# Patient Record
Sex: Female | Born: 2000 | Race: White | Hispanic: No | Marital: Single | Smoking: Never smoker
Health system: Southern US, Community
[De-identification: ages and names within clinical notes are randomized; demographics above are authoritative.]

## PROBLEM LIST (undated history)

## (undated) DIAGNOSIS — J45909 Unspecified asthma, uncomplicated: Secondary | ICD-10-CM

## (undated) DIAGNOSIS — Z9109 Other allergy status, other than to drugs and biological substances: Secondary | ICD-10-CM

## (undated) HISTORY — PX: MYRINGOTOMY WITH TUBE PLACEMENT: SHX5663

---
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
## 2020-07-19 ENCOUNTER — Other Ambulatory Visit: Payer: Self-pay

## 2020-07-19 ENCOUNTER — Encounter (HOSPITAL_BASED_OUTPATIENT_CLINIC_OR_DEPARTMENT_OTHER): Payer: Self-pay

## 2020-07-19 ENCOUNTER — Emergency Department (HOSPITAL_BASED_OUTPATIENT_CLINIC_OR_DEPARTMENT_OTHER): Payer: BLUE CROSS/BLUE SHIELD

## 2020-07-19 ENCOUNTER — Inpatient Hospital Stay (HOSPITAL_BASED_OUTPATIENT_CLINIC_OR_DEPARTMENT_OTHER)
Admission: EM | Admit: 2020-07-19 | Discharge: 2020-07-21 | DRG: 419 | Disposition: A | Discharge status: Home / Self Care | Payer: BLUE CROSS/BLUE SHIELD | Source: Ambulatory Visit | Attending: Surgery | Admitting: Surgery

## 2020-07-19 DIAGNOSIS — K810 Acute cholecystitis: Principal | ICD-10-CM | POA: Diagnosis present

## 2020-07-19 DIAGNOSIS — Z20822 Contact with and (suspected) exposure to covid-19: Secondary | ICD-10-CM | POA: Diagnosis present

## 2020-07-19 DIAGNOSIS — K819 Cholecystitis, unspecified: Secondary | ICD-10-CM

## 2020-07-19 DIAGNOSIS — R1010 Upper abdominal pain, unspecified: Secondary | ICD-10-CM

## 2020-07-19 LAB — COMPREHENSIVE METABOLIC PANEL
ALT: 14 U/L (ref 0–44)
AST: 19 U/L (ref 15–41)
Albumin: 4.1 g/dL (ref 3.5–5.0)
Alkaline Phosphatase: 61 U/L (ref 38–126)
Anion gap: 11 (ref 5–15)
BUN: 5 mg/dL — ABNORMAL LOW (ref 6–20)
CO2: 20 mmol/L — ABNORMAL LOW (ref 22–32)
Calcium: 9.2 mg/dL (ref 8.9–10.3)
Chloride: 104 mmol/L (ref 98–111)
Creatinine, Ser: 0.6 mg/dL (ref 0.44–1.00)
GFR, Estimated: >60 mL/min (ref >60)
Glucose, Bld: 112 mg/dL — ABNORMAL HIGH (ref 70–99)
Potassium: 3.7 mmol/L (ref 3.5–5.1)
Sodium: 135 mmol/L (ref 135–145)
Total Bilirubin: 0.5 mg/dL (ref 0.3–1.2)
Total Protein: 7.7 g/dL (ref 6.5–8.1)

## 2020-07-19 LAB — RESPIRATORY PANEL BY RT PCR (FLU A&B, COVID)
Influenza A by PCR: NEGATIVE
Influenza B by PCR: NEGATIVE
SARS Coronavirus 2 by RT PCR: NEGATIVE

## 2020-07-19 LAB — CBC
HCT: 41.6 % (ref 36.0–46.0)
Hemoglobin: 14.3 g/dL (ref 12.0–15.0)
MCH: 29.3 pg (ref 26.0–34.0)
MCHC: 34.4 g/dL (ref 30.0–36.0)
MCV: 85.2 fL (ref 80.0–100.0)
Platelets: 269 10*3/uL (ref 150–400)
RBC: 4.88 MIL/uL (ref 3.87–5.11)
RDW: 12.3 % (ref 11.5–15.5)
WBC: 16.7 10*3/uL — ABNORMAL HIGH (ref 4.0–10.5)
nRBC: 0 % (ref 0.0–0.2)

## 2020-07-19 LAB — LIPASE, BLOOD: Lipase: 21 U/L (ref 11–51)

## 2020-07-19 MED ORDER — PIPERACILLIN-TAZOBACTAM 3.375 G IVPB 30 MIN
3.3750 g | Freq: Once | INTRAVENOUS | Status: AC
  Administered 2020-07-19: 3.375 g via INTRAVENOUS
  Filled 2020-07-19 (×2): qty 50

## 2020-07-19 MED ORDER — FENTANYL CITRATE (PF) 100 MCG/2ML IJ SOLN
50.0000 ug | Freq: Once | INTRAMUSCULAR | Status: AC
  Administered 2020-07-19: 50 ug via INTRAVENOUS
  Filled 2020-07-19: qty 2

## 2020-07-19 NOTE — ED Triage Notes (Signed)
Pt arrives with c/o abdominal pain after eating Chinese food last night reports NV shortly after eating was seen at [facility] and told to come to ED.

## 2020-07-19 NOTE — ED Provider Notes (Signed)
I assumed care of this patient.  Please see previous provider note for further details of Hx, PE.  Briefly patient is a 19 y.o. female who presented as a transfer from med [facility] where she was found to have acute cholecystitis.  Patient receiving her antibiotics.  Provided with additional pain medicine.  Dr. Cliffton Asters of general surgery made aware of patient's arrival who placed admission orders.       Nira Conn, MD 07/20/20 [phone]

## 2020-07-19 NOTE — Discharge Instructions (Addendum)
[facility] LAPAROSCOPIC SURGERY: POST OP INSTRUCTIONS Always review your discharge instruction sheet given to you by the facility where your surgery was performed. IF YOU HAVE DISABILITY OR FAMILY LEAVE FORMS, YOU MUST BRING THEM TO THE OFFICE FOR PROCESSING.   DO NOT GIVE THEM TO YOUR DOCTOR.  PAIN CONTROL  1. First take acetaminophen (Tylenol) AND/or ibuprofen (Advil) to control your pain after surgery.  Follow directions on package.  Taking acetaminophen (Tylenol) and/or ibuprofen (Advil) regularly after surgery will help to control your pain and lower the amount of prescription pain medication you may need.  You should not take more than 3,000 mg (3 grams) of acetaminophen (Tylenol) in 24 hours.  You should not take ibuprofen (Advil), aleve, motrin, naprosyn or other NSAIDS if you have a history of stomach ulcers or chronic kidney disease.  2. A prescription for pain medication may be given to you upon discharge.  Take your pain medication as prescribed, if you still have uncontrolled pain after taking acetaminophen (Tylenol) or ibuprofen (Advil). 3. Use ice packs to help control pain. 4. If you need a refill on your pain medication, please contact your pharmacy.  They will contact our office to request authorization. Prescriptions will not be filled after 5pm or on week-ends.  HOME MEDICATIONS 5. Take your usually prescribed medications unless otherwise directed.  DIET 6. You should follow a light diet the first few days after arrival home.  Be sure to include lots of fluids daily. Avoid fatty, fried foods.   CONSTIPATION 7. It is common to experience some constipation after surgery and if you are taking pain medication.  Increasing fluid intake and taking a stool softener (such as Colace) will usually help or prevent this problem from occurring.  A mild laxative (Milk of Magnesia or Miralax) should be taken according to package instructions if there are no bowel  movements after 48 hours.  WOUND/INCISION CARE 8. Most patients will experience some swelling and bruising in the area of the incisions.  Ice packs will help.  Swelling and bruising can take several days to resolve.  9. Unless discharge instructions indicate otherwise, follow guidelines below  a. STERI-STRIPS - you may remove your outer bandages 48 hours after surgery, and you may shower at that time.  You have steri-strips (small skin tapes) in place directly over the incision.  These strips should be left on the skin for 7-10 days.   b. DERMABOND/SKIN GLUE - you may shower in 24 hours.  The glue will flake off over the next 2-3 weeks. 10. Any sutures or staples will be removed at the office during your follow-up visit.  ACTIVITIES 11. You may resume regular (light) daily activities beginning the next day--such as daily self-care, walking, climbing stairs--gradually increasing activities as tolerated.  You may have sexual intercourse when it is comfortable.  Refrain from any heavy lifting or straining until approved by your doctor. a. You may drive when you are no longer taking prescription pain medication, you can comfortably wear a seatbelt, and you can safely maneuver your car and apply brakes.  FOLLOW-UP 12. You should see your doctor in the office for a follow-up appointment approximately 2-3 weeks after your surgery.  You should have been given your post-op/follow-up appointment when your surgery was scheduled.  If you did not receive a post-op/follow-up appointment, make sure that you call for this appointment within a day or two after you arrive home to insure a convenient appointment time.  WHEN   TO CALL YOUR DOCTOR: 1. Fever over 101.0 2. Inability to urinate 3. Continued bleeding from incision. 4. Increased pain, redness, or drainage from the incision. 5. Increasing abdominal pain  The clinic staff is available to answer your questions during regular business hours.  Please don't  hesitate to call and ask to speak to one of the nurses for clinical concerns.  If you have a medical emergency, go to the nearest emergency room or call 911.  A surgeon from [facility] is always on call at the hospital. [address], [address], [place], [place]  [address] ? [address] [address], , [place]   [address] [phone] ? [phone] ? FAX [phone] Web site: [email or web address]   Gallbladder Eating Plan If you have a gallbladder condition, you may have trouble digesting fats. Eating a low-fat diet can help reduce your symptoms, and may be helpful before and after having surgery to remove your gallbladder (cholecystectomy). Your health care provider may recommend that you work with a diet and nutrition specialist (dietitian) to help you reduce the amount of fat in your diet. What are tips for following this plan? General guidelines  Limit your fat intake to less than 30% of your total daily calories. If you eat around 1,800 calories each day, this is less than 60 grams (g) of fat per day.  Fat is an important part of a healthy diet. Eating a low-fat diet can make it hard to maintain a healthy body weight. Ask your dietitian how much fat, calories, and other nutrients you need each day.  Eat small, frequent meals throughout the day instead of three large meals.  Drink at least 8-10 cups of fluid a day. Drink enough fluid to keep your urine clear or pale yellow.  Limit alcohol intake to no more than 1 drink a day for nonpregnant women and 2 drinks a day for men. One drink equals 12 oz of beer, 5 oz of wine, or 1 oz of hard liquor. Reading food labels  Check Nutrition Facts on food labels for the amount of fat per serving. Choose foods with less than 3 grams of fat per serving. Shopping  Choose nonfat and low-fat healthy foods. Look for the words "nonfat," "low fat," or "fat free."  Avoid buying processed or prepackaged foods. Cooking  Cook  using low-fat methods, such as baking, broiling, grilling, or boiling.  Cook with small amounts of healthy fats, such as olive oil, grapeseed oil, canola oil, or sunflower oil. What foods are recommended?   All fresh, frozen, or canned fruits and vegetables.  Whole grains.  Low-fat or non-fat (skim) milk and yogurt.  Lean meat, skinless poultry, fish, eggs, and beans.  Low-fat protein supplement powders or drinks.  Spices and herbs. What foods are not recommended?  High-fat foods. These include baked goods, fast food, fatty cuts of meat, ice cream, french toast, sweet rolls, pizza, cheese bread, foods covered with butter, creamy sauces, or cheese.  Fried foods. These include french fries, tempura, battered fish, breaded chicken, fried breads, and sweets.  Foods with strong odors.  Foods that cause bloating and gas. Summary  A low-fat diet can be helpful if you have a gallbladder condition, or before and after gallbladder surgery.  Limit your fat intake to less than 30% of your total daily calories. This is about 60 g of fat if you eat 1,800 calories each day.  Eat small, frequent meals throughout the day instead of three large meals. This information   is not intended to replace advice given to you by your health care provider. Make sure you discuss any questions you have with your health care provider. Document Revised: 12/26/2018 Document Reviewed: 10/12/2016 Elsevier Patient Education  2020 [employer]vier Inc.  

## 2020-07-19 NOTE — ED Provider Notes (Signed)
[facility] EMERGENCY DEPARTMENT Provider Note   CSN: [id number] Arrival date & time: 07/19/20  1653     History Chief Complaint  Patient presents with  . Abdominal Pain    Kaitlyn Dunn is a 19 y.o. female presenting for evaluation of n/v and abd pain.   Pt states she ate chinese food last night and immediate had n/v and upper abd pain. She continued to have n/v throughout the night. She went to UC, was tx with zofran and vomiting resolved. however she continues to have severe abd pain, worsening after any p.o. intake, including liquids.  She denies fevers, chills, chest pain, shortness breath, cough, lower abdominal pain, urinary symptoms, normal bowel movements.  No previous history of abdominal problems.  She is never had abdominal surgeries.  No one else around her is sick.  HPI     Past Medical History:  Diagnosis Date  . Asthma   . Environmental allergies     There are no problems to display for this patient.   Past Surgical History:  Procedure Laterality Date  . MYRINGOTOMY WITH TUBE PLACEMENT       OB History   No obstetric history on file.     No family history on file.  Social History   Tobacco Use  . Smoking status: Never Smoker  . Smokeless tobacco: Never Used  Substance Use Topics  . Alcohol use: No  . Drug use: No    Home Medications Prior to Admission medications   Medication Sig Start Date End Date Taking? Authorizing Provider  dextromethorphan-guaiFENesin (MUCINEX DM) 30-600 MG 12hr tablet Take 1 tablet by mouth 2 (two) times daily. 01/06/18   Aviva Kluver B, PA-C  lidocaine (XYLOCAINE) 2 % solution Use as directed 15 mLs in the mouth or throat every 4 (four) hours as needed. 11/24/14   Pisciotta, Joni Reining, PA-C  Loratadine (CLARITIN PO) Take by mouth.    [provider]    Allergies    Patient has no known allergies.  Review of Systems   Review of Systems  Gastrointestinal: Positive for abdominal pain, nausea and  vomiting.  All other systems reviewed and are negative.   Physical Exam Updated Vital Signs BP (!) 133/93 (BP Location: Left Arm)   Pulse 76   Temp 98.3 F (36.8 C) (Oral)   Resp (!) 24   Ht 5\' 1"  (1.549 m)   Wt 69.9 kg   SpO2 99%   BMI 29.10 kg/m   Physical Exam Vitals and nursing note reviewed.  Constitutional:      General: She is not in acute distress.    Appearance: She is well-developed. She is obese.     Comments: Appears uncomfortable due to pain  HENT:     Head: Normocephalic and atraumatic.  Eyes:     Conjunctiva/sclera: Conjunctivae normal.     Pupils: Pupils are equal, round, and reactive to light.  Cardiovascular:     Rate and Rhythm: Normal rate and regular rhythm.  Pulmonary:     Effort: Pulmonary effort is normal. No respiratory distress.     Breath sounds: Normal breath sounds. No wheezing.  Abdominal:     General: There is no distension.     Palpations: Abdomen is soft.     Tenderness: There is abdominal tenderness in the right upper quadrant and epigastric area.     Comments: Tenderness palpation of the right upper quadrant and epigastric abdomen.  Positive Murphy's.  Musculoskeletal:  General: Normal range of motion.     Cervical back: Normal range of motion and neck supple.  Skin:    General: Skin is warm and dry.     Capillary Refill: Capillary refill takes less than 2 seconds.  Neurological:     Mental Status: She is alert and oriented to person, place, and time.     ED Results / Procedures / Treatments   Labs (all labs ordered are listed, but only abnormal results are displayed) Labs Reviewed  COMPREHENSIVE METABOLIC PANEL - Abnormal; Notable for the following components:      Result Value   CO2 20 (*)    Glucose, Bld 112 (*)    BUN 5 (*)    All other components within normal limits  CBC - Abnormal; Notable for the following components:   WBC 16.7 (*)    All other components within normal limits  RESPIRATORY PANEL BY RT PCR  (FLU A&B, COVID)  LIPASE, BLOOD  URINALYSIS, ROUTINE W REFLEX MICROSCOPIC    EKG None  Radiology US Abdomen Limited RUQ (LIVER/GB)  Result Date: 07/19/2020 CLINICAL DATA:  Right upper quadrant pain for 2 days. Elevated white blood cell count. EXAM: ULTRASOUND ABDOMEN LIMITED RIGHT UPPER QUADRANT COMPARISON:  None. FINDINGS: Gallbladder: Distended. Intraluminal gallstones as well as sludge. Borderline wall thickness of 3 mm, gallbladder wall appears mildly edematous. Small amount of pericholecystic fluid. Positive sonographic Murphy sign noted by sonographer. Common bile duct: Diameter: 3 mm, normal. Liver: There may be mild central intrahepatic biliary ductal dilatation. No focal lesion identified. Within normal limits in parenchymal echogenicity. Portal vein is patent on color Doppler imaging with normal direction of blood flow towards the liver. Other: None. IMPRESSION: 1. Distended gallbladder with gallstones, sludge, borderline wall thickening and pericholecystic fluid. Positive sonographic Murphy sign. Findings are suspicious for acute cholecystitis. 2. Normal caliber common bile duct but there may be mild central intrahepatic biliary ductal dilatation. Electronically Signed   By: Narda Rutherford M.D.   On: 07/19/2020 20:49    Procedures Procedures (including critical care time)  Medications Ordered in ED Medications  piperacillin-tazobactam (ZOSYN) IVPB 3.375 g (has no administration in time range)  fentaNYL (SUBLIMAZE) injection 50 mcg (50 mcg Intravenous Given 07/19/20 2116)    ED Course  I have reviewed the triage vital signs and the nursing notes.  Pertinent labs & imaging results that were available during my care of the patient were reviewed by me and considered in my medical decision making (see chart for details).    MDM Rules/Calculators/A&P                          Patient presenting for evaluation of nausea, vomiting, abdominal pain.  On exam, patient appears  uncomfortable due to pain.  She does have the gastric and right upper quadrant abdominal tenderness with positive Murphy's.  As such, consider gallbladder etiology.  Especially as patient's nausea and vomiting is controlled, however she continues to have pain.  Labs obtained in triage interpreted by me, shows leukocytosis of 16.  Otherwise labs are reassuring, LFTs and lipase normal.  Will obtain ultrasound for further evaluation.  Gallbladder ultrasound shows thickening of the wall, stones, and sludge.  Positive Murphy's.  In the setting of leukocytosis and continued pain, this is consistent with cholecystitis.  Will consult general surgery.  Discussed with Dr. Cliffton Asters from general surgery, recommends ED to ED transfer [facility].  Discussed with Dr. Renaye Rakers from [facility]  ER, who is accepts patient for transfer.  [facility] as this would have delayed care/transport.   Final Clinical Impression(s) / ED Diagnoses Final diagnoses:  Upper abdominal pain  Cholecystitis    Rx / DC Orders ED Discharge Orders    None       Alveria Apley, PA-C 07/19/20 2249    Tilden Fossa, MD 07/19/20 2306

## 2020-07-20 ENCOUNTER — Inpatient Hospital Stay (HOSPITAL_COMMUNITY): Payer: BLUE CROSS/BLUE SHIELD | Admitting: Anesthesiology

## 2020-07-20 ENCOUNTER — Encounter (HOSPITAL_COMMUNITY): Admission: EM | Disposition: A | Discharge status: Home / Self Care | Payer: Self-pay | Source: Ambulatory Visit

## 2020-07-20 DIAGNOSIS — Z20822 Contact with and (suspected) exposure to covid-19: Secondary | ICD-10-CM | POA: Diagnosis present

## 2020-07-20 DIAGNOSIS — K810 Acute cholecystitis: Secondary | ICD-10-CM | POA: Diagnosis present

## 2020-07-20 HISTORY — PX: CHOLECYSTECTOMY: SHX55

## 2020-07-20 LAB — CBC WITH DIFFERENTIAL/PLATELET
Abs Immature Granulocytes: 0.05 10*3/uL (ref 0.00–0.07)
Basophils Absolute: 0.1 10*3/uL (ref 0.0–0.1)
Basophils Relative: 1 %
Eosinophils Absolute: 0.2 10*3/uL (ref 0.0–0.5)
Eosinophils Relative: 2 %
HCT: 38.5 % (ref 36.0–46.0)
Hemoglobin: 12.8 g/dL (ref 12.0–15.0)
Immature Granulocytes: 0 %
Lymphocytes Relative: 23 %
Lymphs Abs: 2.7 10*3/uL (ref 0.7–4.0)
MCH: 29.1 pg (ref 26.0–34.0)
MCHC: 33.2 g/dL (ref 30.0–36.0)
MCV: 87.5 fL (ref 80.0–100.0)
Monocytes Absolute: 0.8 10*3/uL (ref 0.1–1.0)
Monocytes Relative: 7 %
Neutro Abs: 7.9 10*3/uL — ABNORMAL HIGH (ref 1.7–7.7)
Neutrophils Relative %: 67 %
Platelets: 226 10*3/uL (ref 150–400)
RBC: 4.4 MIL/uL (ref 3.87–5.11)
RDW: 12.4 % (ref 11.5–15.5)
WBC: 11.8 10*3/uL — ABNORMAL HIGH (ref 4.0–10.5)
nRBC: 0 % (ref 0.0–0.2)

## 2020-07-20 LAB — URINALYSIS, ROUTINE W REFLEX MICROSCOPIC
Bilirubin Urine: NEGATIVE
Glucose, UA: NEGATIVE mg/dL
Ketones, ur: NEGATIVE mg/dL
Nitrite: NEGATIVE
Protein, ur: NEGATIVE mg/dL
Specific Gravity, Urine: 1.02 (ref 1.005–1.030)
pH: 5 (ref 5.0–8.0)

## 2020-07-20 LAB — COMPREHENSIVE METABOLIC PANEL
ALT: 14 U/L (ref 0–44)
AST: 16 U/L (ref 15–41)
Albumin: 3.5 g/dL (ref 3.5–5.0)
Alkaline Phosphatase: 59 U/L (ref 38–126)
Anion gap: 10 (ref 5–15)
BUN: 6 mg/dL (ref 6–20)
CO2: 22 mmol/L (ref 22–32)
Calcium: 9 mg/dL (ref 8.9–10.3)
Chloride: 105 mmol/L (ref 98–111)
Creatinine, Ser: 0.72 mg/dL (ref 0.44–1.00)
GFR, Estimated: >60 mL/min (ref >60)
Glucose, Bld: 96 mg/dL (ref 70–99)
Potassium: 3.5 mmol/L (ref 3.5–5.1)
Sodium: 137 mmol/L (ref 135–145)
Total Bilirubin: 1.2 mg/dL (ref 0.3–1.2)
Total Protein: 6.7 g/dL (ref 6.5–8.1)

## 2020-07-20 LAB — PREGNANCY, URINE: Preg Test, Ur: NEGATIVE

## 2020-07-20 LAB — HIV ANTIBODY (ROUTINE TESTING W REFLEX): HIV Screen 4th Generation wRfx: NONREACTIVE

## 2020-07-20 SURGERY — LAPAROSCOPIC CHOLECYSTECTOMY
Anesthesia: General

## 2020-07-20 MED ORDER — LIDOCAINE 2% (20 MG/ML) 5 ML SYRINGE
INTRAMUSCULAR | Status: DC | PRN
  Administered 2020-07-20: 60 mg via INTRAVENOUS

## 2020-07-20 MED ORDER — HYDROMORPHONE HCL 1 MG/ML IJ SOLN
0.5000 mg | Freq: Once | INTRAMUSCULAR | Status: AC
  Administered 2020-07-20: 0.5 mg via INTRAVENOUS
  Filled 2020-07-20: qty 1

## 2020-07-20 MED ORDER — MIDAZOLAM HCL 2 MG/2ML IJ SOLN
INTRAMUSCULAR | Status: DC | PRN
  Administered 2020-07-20: 2 mg via INTRAVENOUS

## 2020-07-20 MED ORDER — BUPIVACAINE-EPINEPHRINE 0.25% -1:200000 IJ SOLN
INTRAMUSCULAR | Status: DC | PRN
  Administered 2020-07-20: 30 mL

## 2020-07-20 MED ORDER — FENTANYL CITRATE (PF) 100 MCG/2ML IJ SOLN
INTRAMUSCULAR | Status: AC
  Filled 2020-07-20: qty 2

## 2020-07-20 MED ORDER — MIDAZOLAM HCL 2 MG/2ML IJ SOLN
INTRAMUSCULAR | Status: AC
  Filled 2020-07-20: qty 2

## 2020-07-20 MED ORDER — ONDANSETRON HCL 4 MG/2ML IJ SOLN
INTRAMUSCULAR | Status: DC | PRN
  Administered 2020-07-20: 4 mg via INTRAVENOUS

## 2020-07-20 MED ORDER — ONDANSETRON 4 MG PO TBDP: 4.0000 mg | ORAL_TABLET | Freq: Four times a day (QID) | ORAL | Status: DC | PRN

## 2020-07-20 MED ORDER — LORATADINE 10 MG PO TABS
10.0000 mg | ORAL_TABLET | Freq: Every day | ORAL | Status: DC
  Administered 2020-07-21: 10 mg via ORAL
  Filled 2020-07-20: qty 1

## 2020-07-20 MED ORDER — FENTANYL CITRATE (PF) 250 MCG/5ML IJ SOLN
INTRAMUSCULAR | Status: AC
  Filled 2020-07-20: qty 5

## 2020-07-20 MED ORDER — ALBUTEROL SULFATE HFA 108 (90 BASE) MCG/ACT IN AERS: 2.0000 | INHALATION_SPRAY | RESPIRATORY_TRACT | Status: DC | PRN

## 2020-07-20 MED ORDER — LACTATED RINGERS IV SOLN
INTRAVENOUS | Status: AC | PRN
  Administered 2020-07-20: 1000 mL

## 2020-07-20 MED ORDER — HYDROMORPHONE HCL 1 MG/ML IJ SOLN
0.5000 mg | INTRAMUSCULAR | Status: DC | PRN
  Administered 2020-07-20: 0.5 mg via INTRAVENOUS
  Filled 2020-07-20: qty 0.5

## 2020-07-20 MED ORDER — SODIUM CHLORIDE 0.9 % IV SOLN
INTRAVENOUS | Status: DC | PRN
  Administered 2020-07-20: 250 mL via INTRAVENOUS

## 2020-07-20 MED ORDER — PROPOFOL 10 MG/ML IV BOLUS
INTRAVENOUS | Status: AC
  Filled 2020-07-20: qty 20

## 2020-07-20 MED ORDER — FENTANYL CITRATE (PF) 250 MCG/5ML IJ SOLN
INTRAMUSCULAR | Status: DC | PRN
Start: 2020-07-20 — End: 2020-07-20
  Administered 2020-07-20 (×3): 50 ug via INTRAVENOUS
  Administered 2020-07-20: 100 ug via INTRAVENOUS

## 2020-07-20 MED ORDER — ACETAMINOPHEN 500 MG PO TABS: 1000.0000 mg | ORAL_TABLET | Freq: Once | ORAL | Status: DC

## 2020-07-20 MED ORDER — PROMETHAZINE HCL 25 MG/ML IJ SOLN: 6.2500 mg | INTRAMUSCULAR | Status: DC | PRN

## 2020-07-20 MED ORDER — 0.9 % SODIUM CHLORIDE (POUR BTL) OPTIME
TOPICAL | Status: DC | PRN
  Administered 2020-07-20: 1000 mL

## 2020-07-20 MED ORDER — SUGAMMADEX SODIUM 200 MG/2ML IV SOLN
INTRAVENOUS | Status: DC | PRN
  Administered 2020-07-20: 280 mg via INTRAVENOUS

## 2020-07-20 MED ORDER — ACETAMINOPHEN 500 MG PO TABS
1000.0000 mg | ORAL_TABLET | Freq: Four times a day (QID) | ORAL | Status: DC
  Administered 2020-07-20 (×2): 1000 mg via ORAL
  Filled 2020-07-20 (×2): qty 2

## 2020-07-20 MED ORDER — FENTANYL CITRATE (PF) 100 MCG/2ML IJ SOLN
INTRAMUSCULAR | Status: DC | PRN
Start: 2020-07-20 — End: 2020-07-20
  Administered 2020-07-20 (×4): 50 ug via INTRAVENOUS

## 2020-07-20 MED ORDER — BUPIVACAINE-EPINEPHRINE (PF) 0.25% -1:200000 IJ SOLN
INTRAMUSCULAR | Status: AC
  Filled 2020-07-20: qty 30

## 2020-07-20 MED ORDER — IBUPROFEN 400 MG PO TABS: 600.0000 mg | ORAL_TABLET | Freq: Four times a day (QID) | ORAL | Status: DC | PRN

## 2020-07-20 MED ORDER — SIMETHICONE 80 MG PO CHEW: 40.0000 mg | CHEWABLE_TABLET | Freq: Four times a day (QID) | ORAL | Status: DC | PRN

## 2020-07-20 MED ORDER — OXYCODONE HCL 5 MG/5ML PO SOLN: 5.0000 mg | Freq: Once | ORAL | Status: DC | PRN

## 2020-07-20 MED ORDER — TRAMADOL HCL 50 MG PO TABS: 50.0000 mg | ORAL_TABLET | Freq: Four times a day (QID) | ORAL | Status: DC | PRN

## 2020-07-20 MED ORDER — DIPHENHYDRAMINE HCL 50 MG/ML IJ SOLN: 12.5000 mg | Freq: Four times a day (QID) | INTRAMUSCULAR | Status: DC | PRN

## 2020-07-20 MED ORDER — HYDRALAZINE HCL 20 MG/ML IJ SOLN: 10.0000 mg | INTRAMUSCULAR | Status: DC | PRN

## 2020-07-20 MED ORDER — DESOGESTREL-ETHINYL ESTRADIOL 0.15-30 MG-MCG PO TABS: 1.0000 | ORAL_TABLET | Freq: Every day | ORAL | Status: DC

## 2020-07-20 MED ORDER — MEPERIDINE HCL 50 MG/ML IJ SOLN: 6.2500 mg | INTRAMUSCULAR | Status: DC | PRN

## 2020-07-20 MED ORDER — DIPHENHYDRAMINE HCL 50 MG/ML IJ SOLN: 25.0000 mg | Freq: Four times a day (QID) | INTRAMUSCULAR | Status: DC | PRN

## 2020-07-20 MED ORDER — PROPOFOL 10 MG/ML IV BOLUS
INTRAVENOUS | Status: DC | PRN
  Administered 2020-07-20: 200 mg via INTRAVENOUS

## 2020-07-20 MED ORDER — OXYCODONE HCL 5 MG PO TABS: 5.0000 mg | ORAL_TABLET | Freq: Once | ORAL | Status: DC | PRN

## 2020-07-20 MED ORDER — DOCUSATE SODIUM 100 MG PO CAPS
100.0000 mg | ORAL_CAPSULE | Freq: Two times a day (BID) | ORAL | Status: DC
  Administered 2020-07-20 – 2020-07-21 (×2): 100 mg via ORAL
  Filled 2020-07-20 (×2): qty 1

## 2020-07-20 MED ORDER — ACETAMINOPHEN 500 MG PO TABS
1000.0000 mg | ORAL_TABLET | Freq: Four times a day (QID) | ORAL | Status: DC
  Administered 2020-07-20 – 2020-07-21 (×4): 1000 mg via ORAL
  Filled 2020-07-20 (×4): qty 2

## 2020-07-20 MED ORDER — DEXAMETHASONE SODIUM PHOSPHATE 10 MG/ML IJ SOLN
INTRAMUSCULAR | Status: DC | PRN
  Administered 2020-07-20: 10 mg via INTRAVENOUS

## 2020-07-20 MED ORDER — LACTATED RINGERS IV SOLN: INTRAVENOUS | Status: DC

## 2020-07-20 MED ORDER — ONDANSETRON HCL 4 MG/2ML IJ SOLN: 4.0000 mg | Freq: Four times a day (QID) | INTRAMUSCULAR | Status: DC | PRN

## 2020-07-20 MED ORDER — CHLORHEXIDINE GLUCONATE 0.12 % MT SOLN
15.0000 mL | Freq: Once | OROMUCOSAL | Status: AC
  Administered 2020-07-20: 15 mL via OROMUCOSAL

## 2020-07-20 MED ORDER — DIPHENHYDRAMINE HCL 12.5 MG/5ML PO ELIX: 12.5000 mg | ORAL_SOLUTION | Freq: Four times a day (QID) | ORAL | Status: DC | PRN

## 2020-07-20 MED ORDER — ENOXAPARIN SODIUM 40 MG/0.4ML ~~LOC~~ SOLN
40.0000 mg | SUBCUTANEOUS | Status: DC
  Administered 2020-07-21: 40 mg via SUBCUTANEOUS
  Filled 2020-07-20: qty 0.4

## 2020-07-20 MED ORDER — AMISULPRIDE (ANTIEMETIC) 5 MG/2ML IV SOLN: 10.0000 mg | Freq: Once | INTRAVENOUS | Status: DC | PRN

## 2020-07-20 MED ORDER — DIPHENHYDRAMINE HCL 25 MG PO CAPS: 25.0000 mg | ORAL_CAPSULE | Freq: Four times a day (QID) | ORAL | Status: DC | PRN

## 2020-07-20 MED ORDER — MORPHINE SULFATE (PF) 2 MG/ML IV SOLN: 2.0000 mg | INTRAVENOUS | Status: DC | PRN

## 2020-07-20 MED ORDER — DOCUSATE SODIUM 100 MG PO CAPS: 200.0000 mg | ORAL_CAPSULE | Freq: Two times a day (BID) | ORAL | Status: DC

## 2020-07-20 MED ORDER — HYDROMORPHONE HCL 1 MG/ML IJ SOLN: 0.2500 mg | INTRAMUSCULAR | Status: DC | PRN

## 2020-07-20 MED ORDER — LIDOCAINE 2% (20 MG/ML) 5 ML SYRINGE
INTRAMUSCULAR | Status: AC
  Filled 2020-07-20: qty 5

## 2020-07-20 MED ORDER — KETOROLAC TROMETHAMINE 30 MG/ML IJ SOLN: 30.0000 mg | Freq: Once | INTRAMUSCULAR | Status: DC | PRN

## 2020-07-20 MED ORDER — PIPERACILLIN-TAZOBACTAM 3.375 G IVPB
3.3750 g | Freq: Three times a day (TID) | INTRAVENOUS | Status: DC
  Administered 2020-07-20 (×2): 3.375 g via INTRAVENOUS
  Filled 2020-07-20 (×3): qty 50

## 2020-07-20 MED ORDER — HEPARIN SODIUM (PORCINE) 5000 UNIT/ML IJ SOLN
5000.0000 [IU] | Freq: Three times a day (TID) | INTRAMUSCULAR | Status: DC
  Administered 2020-07-20: 5000 [IU] via SUBCUTANEOUS
  Filled 2020-07-20: qty 1

## 2020-07-20 MED ORDER — SIMETHICONE 80 MG PO CHEW
80.0000 mg | CHEWABLE_TABLET | Freq: Four times a day (QID) | ORAL | Status: DC | PRN
  Administered 2020-07-20: 80 mg via ORAL
  Filled 2020-07-20: qty 1

## 2020-07-20 MED ORDER — OXYCODONE HCL 5 MG PO TABS
5.0000 mg | ORAL_TABLET | ORAL | Status: DC | PRN
  Administered 2020-07-20 – 2020-07-21 (×3): 5 mg via ORAL
  Filled 2020-07-20 (×3): qty 1

## 2020-07-20 MED ORDER — FENTANYL CITRATE (PF) 100 MCG/2ML IJ SOLN
25.0000 ug | INTRAMUSCULAR | Status: DC | PRN
  Administered 2020-07-20: 25 ug via INTRAVENOUS
  Administered 2020-07-20: 50 ug via INTRAVENOUS
  Administered 2020-07-20: 25 ug via INTRAVENOUS

## 2020-07-20 MED ORDER — ROCURONIUM BROMIDE 10 MG/ML (PF) SYRINGE
PREFILLED_SYRINGE | INTRAVENOUS | Status: DC | PRN
  Administered 2020-07-20: 60 mg via INTRAVENOUS

## 2020-07-20 MED ORDER — SODIUM CHLORIDE 0.9 % IV SOLN: INTRAVENOUS | Status: DC

## 2020-07-20 SURGICAL SUPPLY — 34 items
APPLIER CLIP 5 13 M/L LIGAMAX5 (MISCELLANEOUS) ×3
CABLE HIGH FREQUENCY MONO STRZ (ELECTRODE) ×3 IMPLANT
CHLORAPREP W/TINT 26 (MISCELLANEOUS) ×3 IMPLANT
CLIP APPLIE 5 13 M/L LIGAMAX5 (MISCELLANEOUS) ×1 IMPLANT
COVER MAYO STAND STRL (DRAPES) IMPLANT
COVER WAND RF STERILE (DRAPES) IMPLANT
DERMABOND ADVANCED (GAUZE/BANDAGES/DRESSINGS) ×2
DERMABOND ADVANCED .7 DNX12 (GAUZE/BANDAGES/DRESSINGS) ×1 IMPLANT
DRAPE C-ARM 42X120 X-RAY (DRAPES) IMPLANT
ELECT REM PT RETURN 15FT ADLT (MISCELLANEOUS) ×3 IMPLANT
GLOVE BIO SURGEON STRL SZ 6.5 (GLOVE) ×2 IMPLANT
GLOVE BIO SURGEONS STRL SZ 6.5 (GLOVE) ×1
GLOVE BIOGEL PI IND STRL 7.0 (GLOVE) ×1 IMPLANT
GLOVE BIOGEL PI INDICATOR 7.0 (GLOVE) ×2
GOWN STRL REUS W/TWL XL LVL3 (GOWN DISPOSABLE) ×9 IMPLANT
IRRIG SUCT STRYKERFLOW 2 WTIP (MISCELLANEOUS) ×3
IRRIGATION SUCT STRKRFLW 2 WTP (MISCELLANEOUS) ×1 IMPLANT
IV CATH 14GX2 1/4 (CATHETERS) IMPLANT
KIT BASIN OR (CUSTOM PROCEDURE TRAY) ×3 IMPLANT
KIT TURNOVER KIT A (KITS) IMPLANT
PENCIL SMOKE EVACUATOR (MISCELLANEOUS) IMPLANT
POUCH SPECIMEN RETRIEVAL 10MM (ENDOMECHANICALS) ×3 IMPLANT
SCISSORS LAP 5X35 DISP (ENDOMECHANICALS) ×3 IMPLANT
SET CHOLANGIOGRAPH MIX (MISCELLANEOUS) IMPLANT
SET TUBE SMOKE EVAC HIGH FLOW (TUBING) ×3 IMPLANT
SLEEVE XCEL OPT CAN 5 100 (ENDOMECHANICALS) ×6 IMPLANT
SUT VIC AB 2-0 SH 27 (SUTURE) ×2
SUT VIC AB 2-0 SH 27X BRD (SUTURE) ×1 IMPLANT
SUT VIC AB 4-0 PS2 18 (SUTURE) ×9 IMPLANT
TOWEL OR 17X26 10 PK STRL BLUE (TOWEL DISPOSABLE) ×3 IMPLANT
TOWEL OR NON WOVEN STRL DISP B (DISPOSABLE) ×3 IMPLANT
TRAY LAPAROSCOPIC (CUSTOM PROCEDURE TRAY) ×3 IMPLANT
TROCAR BLADELESS OPT 5 100 (ENDOMECHANICALS) ×3 IMPLANT
TROCAR XCEL BLUNT TIP 100MML (ENDOMECHANICALS) ×3 IMPLANT

## 2020-07-20 NOTE — Progress Notes (Signed)
Cholecystitis  Subjective: Pain about the same  Objective: Vital signs in last 24 hours: Temp:  [98.3 F (36.8 C)] 98.3 F (36.8 C) (11/02 0418) Pulse Rate:  [59-103] 59 (11/02 0811) Resp:  [16-20] 18 (11/02 0811) BP: (118-153)/(65-98) 118/65 (11/02 0811) SpO2:  [96 %-100 %] 98 % (11/02 0811) Weight:  [69.9 kg] 69.9 kg (11/01 1702) Last BM Date: 08/17/20  Intake/Output from previous day: 11/01 0701 - 11/02 0700 In: 131.2 [I.V.:131.2] Out: -  Intake/Output this shift: No intake/output data recorded.  General appearance: alert and cooperative GI: normal findings: soft, RUQ TTP  Lab Results:  Results for orders placed or performed during the hospital encounter of 07/19/20 (from the past 24 hour(s))  Lipase, blood     Status: None   Collection Time: 07/19/20  5:12 PM  Result Value Ref Range   Lipase 21 11 - 51 U/L  Comprehensive metabolic panel     Status: Abnormal   Collection Time: 07/19/20  5:12 PM  Result Value Ref Range   Sodium 135 135 - 145 mmol/L   Potassium 3.7 3.5 - 5.1 mmol/L   Chloride 104 98 - 111 mmol/L   CO2 20 (L) 22 - 32 mmol/L   Glucose, Bld 112 (H) 70 - 99 mg/dL   BUN 5 (L) 6 - 20 mg/dL   Creatinine, Ser 4.68 0.44 - 1.00 mg/dL   Calcium 9.2 8.9 - 03.2 mg/dL   Total Protein 7.7 6.5 - 8.1 g/dL   Albumin 4.1 3.5 - 5.0 g/dL   AST 19 15 - 41 U/L   ALT 14 0 - 44 U/L   Alkaline Phosphatase 61 38 - 126 U/L   Total Bilirubin 0.5 0.3 - 1.2 mg/dL   GFR, Estimated >12 >24 mL/min   Anion gap 11 5 - 15  CBC     Status: Abnormal   Collection Time: 07/19/20  5:12 PM  Result Value Ref Range   WBC 16.7 (H) 4.0 - 10.5 K/uL   RBC 4.88 3.87 - 5.11 MIL/uL   Hemoglobin 14.3 12.0 - 15.0 g/dL   HCT 82.5 36 - 46 %   MCV 85.2 80.0 - 100.0 fL   MCH 29.3 26.0 - 34.0 pg   MCHC 34.4 30.0 - 36.0 g/dL   RDW 00.3 70.4 - 88.8 %   Platelets 269 150 - 400 K/uL   nRBC 0.0 0.0 - 0.2 %  Respiratory Panel by RT PCR (Flu A&B, Covid) - Nasopharyngeal Swab     Status: None    Collection Time: 07/19/20  9:20 PM   Specimen: Nasopharyngeal Swab  Result Value Ref Range   SARS Coronavirus 2 by RT PCR NEGATIVE NEGATIVE   Influenza A by PCR NEGATIVE NEGATIVE   Influenza B by PCR NEGATIVE NEGATIVE  Comprehensive metabolic panel     Status: None   Collection Time: 07/20/20  5:31 AM  Result Value Ref Range   Sodium 137 135 - 145 mmol/L   Potassium 3.5 3.5 - 5.1 mmol/L   Chloride 105 98 - 111 mmol/L   CO2 22 22 - 32 mmol/L   Glucose, Bld 96 70 - 99 mg/dL   BUN 6 6 - 20 mg/dL   Creatinine, Ser 9.16 0.44 - 1.00 mg/dL   Calcium 9.0 8.9 - 94.5 mg/dL   Total Protein 6.7 6.5 - 8.1 g/dL   Albumin 3.5 3.5 - 5.0 g/dL   AST 16 15 - 41 U/L   ALT 14 0 - 44 U/L   Alkaline  Phosphatase 59 38 - 126 U/L   Total Bilirubin 1.2 0.3 - 1.2 mg/dL   GFR, Estimated >22 >02 mL/min   Anion gap 10 5 - 15  CBC WITH DIFFERENTIAL     Status: Abnormal   Collection Time: 07/20/20  5:31 AM  Result Value Ref Range   WBC 11.8 (H) 4.0 - 10.5 K/uL   RBC 4.40 3.87 - 5.11 MIL/uL   Hemoglobin 12.8 12.0 - 15.0 g/dL   HCT 54.2 36 - 46 %   MCV 87.5 80.0 - 100.0 fL   MCH 29.1 26.0 - 34.0 pg   MCHC 33.2 30.0 - 36.0 g/dL   RDW 70.6 23.7 - 62.8 %   Platelets 226 150 - 400 K/uL   nRBC 0.0 0.0 - 0.2 %   Neutrophils Relative % 67 %   Neutro Abs 7.9 (H) 1.7 - 7.7 K/uL   Lymphocytes Relative 23 %   Lymphs Abs 2.7 0.7 - 4.0 K/uL   Monocytes Relative 7 %   Monocytes Absolute 0.8 0.1 - 1.0 K/uL   Eosinophils Relative 2 %   Eosinophils Absolute 0.2 0.0 - 0.5 K/uL   Basophils Relative 1 %   Basophils Absolute 0.1 0.0 - 0.1 K/uL   Immature Granulocytes 0 %   Abs Immature Granulocytes 0.05 0.00 - 0.07 K/uL  Urinalysis, Routine w reflex microscopic     Status: Abnormal   Collection Time: 07/20/20  7:01 AM  Result Value Ref Range   Color, Urine AMBER (A) YELLOW   APPearance HAZY (A) CLEAR   Specific Gravity, Urine 1.020 1.005 - 1.030   pH 5.0 5.0 - 8.0   Glucose, UA NEGATIVE NEGATIVE mg/dL   Hgb urine  dipstick MODERATE (A) NEGATIVE   Bilirubin Urine NEGATIVE NEGATIVE   Ketones, ur NEGATIVE NEGATIVE mg/dL   Protein, ur NEGATIVE NEGATIVE mg/dL   Nitrite NEGATIVE NEGATIVE   Leukocytes,Ua TRACE (A) NEGATIVE   RBC / HPF 0-5 0 - 5 RBC/hpf   WBC, UA 6-10 0 - 5 WBC/hpf   Bacteria, UA MANY (A) NONE SEEN   Squamous Epithelial / LPF 0-5 0 - 5   Mucus PRESENT      Studies/Results Radiology     MEDS, Scheduled . acetaminophen  1,000 mg Oral Q6H  . docusate sodium  200 mg Oral BID  . heparin  5,000 Units Subcutaneous Q8H     Assessment: <principal problem not specified> Acute cholecystitis  Plan: OR today for lap chole.  The anatomy & physiology of hepatobiliary & pancreatic function was discussed.  The pathophysiology of gallbladder dysfunction was discussed.  Natural history risks without surgery was discussed.   I feel the risks of no intervention will lead to serious problems that outweigh the operative risks; therefore, I recommended cholecystectomy to remove the pathology.  I explained laparoscopic techniques with possible need for an open approach.  Probable cholangiogram to evaluate the bilary tract was explained as well.    Risks such as bleeding, infection, abscess, leak, injury to other organs, need for further treatment, heart attack, death, and other risks were discussed.  I noted a good likelihood this will help address the problem.  Possibility that this will not correct all abdominal symptoms was explained.  Goals of post-operative recovery were discussed as well.  We will work to minimize complications.  An educational handout further explaining the pathology and treatment options was given as well.  Questions were answered.  The patient expresses understanding & wishes to proceed with surgery.  Vanita Panda, MD [facility], [place]    07/20/2020 9:03 AM

## 2020-07-20 NOTE — ED Notes (Signed)
Report given to courtney rn

## 2020-07-20 NOTE — Transfer of Care (Signed)
Immediate Anesthesia Transfer of Care Note  Patient: Kaitlyn Dunn  Procedure(s) Performed: LAPAROSCOPIC CHOLECYSTECTOMY (N/A )  Patient Location: PACU  Anesthesia Type:General  Level of Consciousness: awake and alert   Airway & Oxygen Therapy: Patient Spontanous Breathing and Patient connected to face mask oxygen  Post-op Assessment: Report given to RN and Post -op Vital signs reviewed and stable  Post vital signs: Reviewed and stable  Last Vitals:  Vitals Value Taken Time  BP 147/92 07/20/20 1345  Temp    Pulse 96 07/20/20 1348  Resp 10 07/20/20 1348  SpO2 100 % 07/20/20 1348  Vitals shown include unvalidated device data.  Last Pain:  Vitals:   07/20/20 0512  TempSrc:   PainSc: Asleep      Patients Stated Pain Goal: 4 (07/20/20 0442)  Complications: No complications documented.

## 2020-07-20 NOTE — Op Note (Signed)
07/20/2020  1:34 PM  PATIENT:  Kaitlyn Dunn  19 y.o. female  Patient Care Team: Pediatrics, [facility] as PCP - General (Pediatrics)  PRE-OPERATIVE DIAGNOSIS:  cholecystitis  POST-OPERATIVE DIAGNOSIS:  cholecystitis  PROCEDURE:   LAPAROSCOPIC CHOLECYSTECTOMY    Surgeon(s): Romie Levee, MD  ASSISTANT: Hosie Spangle PA  ANESTHESIA:   local and general  EBL: 60ml  Total I/O In: 170 [IV Piggyback:170] Out: 20 [Blood:20]  DRAINS: none   SPECIMEN:  Source of Specimen:  gallbladder  DISPOSITION OF SPECIMEN:  PATHOLOGY  COUNTS:  YES  PLAN OF CARE: Pt already admitted  PATIENT DISPOSITION:  PACU - hemodynamically stable.  INDICATION: 19 y.o. F with acute cholecystitis   The anatomy & physiology of hepatobiliary & pancreatic function was discussed.  The pathophysiology of gallbladder dysfunction was discussed.  Natural history risks without surgery was discussed.   I feel the risks of no intervention will lead to serious problems that outweigh the operative risks; therefore, I recommended cholecystectomy to remove the pathology.  I explained laparoscopic techniques with possible need for an open approach.  Probable cholangiogram to evaluate the bilary tract was explained as well.    Risks such as bleeding, infection, abscess, leak, injury to other organs, need for further treatment, heart attack, death, and other risks were discussed.  I noted a good likelihood this will help address the problem.  Possibility that this will not correct all abdominal symptoms was explained.  Goals of post-operative recovery were discussed as well.    OR FINDINGS: acute cholecystitis  DESCRIPTION:   The patient was identified & brought into the operating room. The patient was positioned supine with arms tucked. SCDs were active during the entire case. The patient underwent general anesthesia without any difficulty.  The abdomen was prepped and draped in a sterile fashion. A  Surgical Timeout was performed and confirmed our plan.  We positioned the patient in reverse [place] & right side up.  I placed a Hassan laparoscopic port through the umbilicus using open entry technique.  Entry was clean. There were no adhesions to the anterior abdominal wall supraumbilically.  We induced carbon dioxide insufflation. Camera inspection revealed no injury.   I proceeded to continue with laparoscopic technique. I placed a 5 mm port in mid subcostal region, another 97mm port in the right flank near the anterior axillary line, and a 69mm port in the left subxiphoid region obliquely within the falciform ligament.  I turned attention to the right upper quadrant.  The gallbladder fundus was elevated cephalad. I used cautery and blunt dissection to free the peritoneal coverings between the gallbladder and the liver on the posteriolateral and anteriomedial walls.   I used careful blunt and cautery dissection with a maryland dissector to help get a good critical view of the cystic artery and cystic duct. I did further dissection to free a few centimeters of the  gallbladder off the liver bed to get a good critical view of the infundibulum and cystic duct. I mobilized the cystic artery.  I skeletonized the cystic duct.  After getting a good 360 critical view, I decided not to perform a cholangiogram.    I placed clips on the cystic duct x3.  I completed cystic duct transection.   I placed clips on the cystic artery x3 with 2 proximally.  I ligated the cystic artery using scissors. I freed the gallbladder from its remaining attachments to the liver. I ensured hemostasis on the gallbladder fossa of the liver and  elsewhere. I inspected the rest of the abdomen & detected no injury nor bleeding elsewhere.  I irrigated the RUQ with normal saline.  I removed the gallbladder through the umbilical port site.  I closed the umbilical fascia using 0 Vicryl stitches.   I closed the skin using 4-0 vicryl stitch.   Sterile dressings were applied. The patient was extubated & arrived in the PACU in stable condition.  I had discussed postoperative care with the patient in the holding area.   I will discuss operative findings and postoperative goals / instructions with the patient's family.  Instructions are written in the chart as well.

## 2020-07-20 NOTE — Progress Notes (Signed)
Pt ambulated in hall without complication.

## 2020-07-20 NOTE — Progress Notes (Signed)
Dr. Magnus Ivan notified of pt need for prn for gas and bloating pain. RN awaiting call back or order placed in emr.

## 2020-07-20 NOTE — Plan of Care (Signed)
  Problem: Clinical Measurements: Goal: Respiratory complications will improve Outcome: Progressing   Problem: Clinical Measurements: Goal: Cardiovascular complication will be avoided Outcome: Progressing   Problem: Elimination: Goal: Will not experience complications related to bowel motility Outcome: Progressing   Problem: Skin Integrity: Goal: Risk for impaired skin integrity will decrease Outcome: Progressing   

## 2020-07-20 NOTE — Consult Note (Signed)
CC/Reason for consult: RUQ pain; acute cholecystitis  Requesting provider: Dr. Eudelia Bunch  HPI: Kaitlyn Dunn is an 19 y.o. female with hx asthma presented to [facility] with acute onset RUQ pain that is shar x1 day following chinese food. Associated n/v. Has tried Zofran with minimal alleviation in symptoms. Pain does not radiate. Nothing makes it better or worse. Moderate in severity. Denies ever having had anything like this before.  PSH: Denies any prior abdominal surgical history.  Past Medical History:  Diagnosis Date   Asthma    Environmental allergies     Past Surgical History:  Procedure Laterality Date   MYRINGOTOMY WITH TUBE PLACEMENT      No family history on file.  Social:  reports that she has never smoked. She has never used smokeless tobacco. She reports that she does not drink alcohol and does not use drugs.  Allergies: No Known Allergies  Medications: I have reviewed the patient's current medications.  Results for orders placed or performed during the hospital encounter of 07/19/20 (from the past 48 hour(s))  Lipase, blood     Status: None   Collection Time: 07/19/20  5:12 PM  Result Value Ref Range   Lipase 21 11 - 51 U/L    Comment: Performed at [facility], [address] [place], [place] [address]  Comprehensive metabolic panel     Status: Abnormal   Collection Time: 07/19/20  5:12 PM  Result Value Ref Range   Sodium 135 135 - 145 mmol/L   Potassium 3.7 3.5 - 5.1 mmol/L   Chloride 104 98 - 111 mmol/L   CO2 20 (L) 22 - 32 mmol/L   Glucose, Bld 112 (H) 70 - 99 mg/dL    Comment: Glucose reference range applies only to samples taken after fasting for at least 8 hours.   BUN 5 (L) 6 - 20 mg/dL   Creatinine, Ser 2.12 0.44 - 1.00 mg/dL   Calcium 9.2 8.9 - 24.8 mg/dL   Total Protein 7.7 6.5 - 8.1 g/dL   Albumin 4.1 3.5 - 5.0 g/dL   AST 19 15 - 41 U/L   ALT 14 0 - 44 U/L   Alkaline Phosphatase 61 38 - 126 U/L   Total Bilirubin 0.5 0.3 - 1.2  mg/dL   GFR, Estimated >25 >00 mL/min    Comment: (NOTE) Calculated using the CKD-EPI Creatinine Equation (2021)    Anion gap 11 5 - 15    Comment: Performed at [facility], [address] [place], [place] [address]  CBC     Status: Abnormal   Collection Time: 07/19/20  5:12 PM  Result Value Ref Range   WBC 16.7 (H) 4.0 - 10.5 K/uL   RBC 4.88 3.87 - 5.11 MIL/uL   Hemoglobin 14.3 12.0 - 15.0 g/dL   HCT 88.9 36 - 46 %   MCV 85.2 80.0 - 100.0 fL   MCH 29.3 26.0 - 34.0 pg   MCHC 34.4 30.0 - 36.0 g/dL   RDW 16.9 45.0 - 38.8 %   Platelets 269 150 - 400 K/uL   nRBC 0.0 0.0 - 0.2 %    Comment: Performed at [facility], [address] [place], [place] [address]  Respiratory Panel by RT PCR (Flu A&B, Covid) - Nasopharyngeal Swab     Status: None   Collection Time: 07/19/20  9:20 PM   Specimen: Nasopharyngeal Swab  Result Value Ref Range   SARS Coronavirus 2 by  RT PCR NEGATIVE NEGATIVE    Comment: (NOTE) SARS-CoV-2 target nucleic acids are NOT DETECTED.  The SARS-CoV-2 RNA is generally detectable in upper respiratoy specimens during the acute phase of infection. The lowest concentration of SARS-CoV-2 viral copies this assay can detect is 131 copies/mL. A negative result does not preclude SARS-Cov-2 infection and should not be used as the sole basis for treatment or other patient management decisions. A negative result may occur with  improper specimen collection/handling, submission of specimen other than nasopharyngeal swab, presence of viral mutation(s) within the areas targeted by this assay, and inadequate number of viral copies (<131 copies/mL). A negative result must be combined with clinical observations, patient history, and epidemiological information. The expected result is Negative.  Fact Sheet for Patients:  https://www.moore.com/  Fact Sheet for Healthcare Providers:  https://www.young.biz/  This test is  no t yet approved or cleared by the [place] FDA and  has been authorized for detection and/or diagnosis of SARS-CoV-2 by FDA under an Emergency Use Authorization (EUA). This EUA will remain  in effect (meaning this test can be used) for the duration of the COVID-19 declaration under Section 564(b)(1) of the Act, 21 U.S.C. section 360bbb-3(b)(1), unless the authorization is terminated or revoked sooner.     Influenza A by PCR NEGATIVE NEGATIVE   Influenza B by PCR NEGATIVE NEGATIVE    Comment: (NOTE) The Xpert Xpress SARS-CoV-2/FLU/RSV assay is intended as an aid in  the diagnosis of influenza from Nasopharyngeal swab specimens and  should not be used as a sole basis for treatment. Nasal washings and  aspirates are unacceptable for Xpert Xpress SARS-CoV-2/FLU/RSV  testing.  Fact Sheet for Patients: https://www.moore.com/  Fact Sheet for Healthcare Providers: https://www.young.biz/  This test is not yet approved or cleared by the [place] FDA and  has been authorized for detection and/or diagnosis of SARS-CoV-2 by  FDA under an Emergency Use Authorization (EUA). This EUA will remain  in effect (meaning this test can be used) for the duration of the  Covid-19 declaration under Section 564(b)(1) of the Act, 21  U.S.C. section 360bbb-3(b)(1), unless the authorization is  terminated or revoked. Performed at [facility], [address] [place], [place] [address]     US Abdomen Limited RUQ (LIVER/GB)  Result Date: 07/19/2020 CLINICAL DATA:  Right upper quadrant pain for 2 days. Elevated Kyran Connaughton blood cell count. EXAM: ULTRASOUND ABDOMEN LIMITED RIGHT UPPER QUADRANT COMPARISON:  None. FINDINGS: Gallbladder: Distended. Intraluminal gallstones as well as sludge. Borderline wall thickness of 3 mm, gallbladder wall appears mildly edematous. Small amount of pericholecystic fluid. Positive sonographic Murphy sign noted by sonographer.  Common bile duct: Diameter: 3 mm, normal. Liver: There may be mild central intrahepatic biliary ductal dilatation. No focal lesion identified. Within normal limits in parenchymal echogenicity. Portal vein is patent on color Doppler imaging with normal direction of blood flow towards the liver. Other: None. IMPRESSION: 1. Distended gallbladder with gallstones, sludge, borderline wall thickening and pericholecystic fluid. Positive sonographic Murphy sign. Findings are suspicious for acute cholecystitis. 2. Normal caliber common bile duct but there may be mild central intrahepatic biliary ductal dilatation. Electronically Signed   By: Narda Rutherford M.D.   On: 07/19/2020 20:49    ROS - all of the below systems have been reviewed with the patient and positives are indicated with bold text General: chills, fever or night sweats Eyes: blurry vision or double vision ENT: epistaxis or sore throat Allergy/Immunology: itchy/watery eyes or nasal congestion Hematologic/Lymphatic:  bleeding problems, blood clots or swollen lymph nodes Endocrine: temperature intolerance or unexpected weight changes Breast: new or changing breast lumps or nipple discharge Resp: cough, shortness of breath, or wheezing CV: chest pain or dyspnea on exertion GI: as per HPI GU: dysuria, trouble voiding, or hematuria MSK: joint pain or joint stiffness Neuro: TIA or stroke symptoms Derm: pruritus and skin lesion changes Psych: anxiety and depression  PE Blood pressure 128/80, pulse 68, temperature 98.3 F (36.8 C), temperature source Oral, resp. rate 20, height 5\' 1"  (1.549 m), weight 69.9 kg, SpO2 99 %. Constitutional: NAD; conversant; no deformities Eyes: Moist conjunctiva; no lid lag; anicteric; pupils equal and round Neck: Trachea midline; no thyromegaly Lungs: Normal respiratory effort; no tactile fremitus CV: RRR; no palpable thrills; no pitting edema GI: Abd soft, mild/mod tenderness in RUQ; no tenderness elsewhere;  nondistended; no rebound nor guarding ; no palpable hepatosplenomegaly MSK: Normal range of motion of extremities; no clubbing/cyanosis Psychiatric: Appropriate affect; alert and oriented x3 Lymphatic: No palpable cervical or axillary lymphadenopathy  Results for orders placed or performed during the hospital encounter of 07/19/20 (from the past 48 hour(s))  Lipase, blood     Status: None   Collection Time: 07/19/20  5:12 PM  Result Value Ref Range   Lipase 21 11 - 51 U/L    Comment: Performed at [facility], [address] [place], [place]  Comprehensive metabolic panel     Status: Abnormal   Collection Time: 07/19/20  5:12 PM  Result Value Ref Range   Sodium 135 135 - 145 mmol/L   Potassium 3.7 3.5 - 5.1 mmol/L   Chloride 104 98 - 111 mmol/L   CO2 20 (L) 22 - 32 mmol/L   Glucose, Bld 112 (H) 70 - 99 mg/dL    Comment: Glucose reference range applies only to samples taken after fasting for at least 8 hours.   BUN 5 (L) 6 - 20 mg/dL   Creatinine, Ser 13/01/21 0.44 - 1.00 mg/dL   Calcium 9.2 8.9 - 2.58 mg/dL   Total Protein 7.7 6.5 - 8.1 g/dL   Albumin 4.1 3.5 - 5.0 g/dL   AST 19 15 - 41 U/L   ALT 14 0 - 44 U/L   Alkaline Phosphatase 61 38 - 126 U/L   Total Bilirubin 0.5 0.3 - 1.2 mg/dL   GFR, Estimated 52.7 >78 mL/min    Comment: (NOTE) Calculated using the CKD-EPI Creatinine Equation (2021)    Anion gap 11 5 - 15    Comment: Performed at [facility], [address] [place], [place]  CBC     Status: Abnormal   Collection Time: 07/19/20  5:12 PM  Result Value Ref Range   WBC 16.7 (H) 4.0 - 10.5 K/uL   RBC 4.88 3.87 - 5.11 MIL/uL   Hemoglobin 14.3 12.0 - 15.0 g/dL   HCT 13/01/21 36 - 46 %   MCV 85.2 80.0 - 100.0 fL   MCH 29.3 26.0 - 34.0 pg   MCHC 34.4 30.0 - 36.0 g/dL   RDW 14.4 31.5 - 40.0 %   Platelets 269 150 - 400 K/uL   nRBC 0.0 0.0 - 0.2 %    Comment: Performed at [facility], [address] [place], [place]  [place]  Respiratory Panel by RT PCR (Flu A&B, Covid) - Nasopharyngeal Swab     Status: None   Collection Time: 07/19/20  9:20 PM  Specimen: Nasopharyngeal Swab  Result Value Ref Range   SARS Coronavirus 2 by RT PCR NEGATIVE NEGATIVE    Comment: (NOTE) SARS-CoV-2 target nucleic acids are NOT DETECTED.  The SARS-CoV-2 RNA is generally detectable in upper respiratoy specimens during the acute phase of infection. The lowest concentration of SARS-CoV-2 viral copies this assay can detect is 131 copies/mL. A negative result does not preclude SARS-Cov-2 infection and should not be used as the sole basis for treatment or other patient management decisions. A negative result may occur with  improper specimen collection/handling, submission of specimen other than nasopharyngeal swab, presence of viral mutation(s) within the areas targeted by this assay, and inadequate number of viral copies (<131 copies/mL). A negative result must be combined with clinical observations, patient history, and epidemiological information. The expected result is Negative.  Fact Sheet for Patients:  https://www.moore.com/  Fact Sheet for Healthcare Providers:  https://www.young.biz/  This test is no t yet approved or cleared by the [place] FDA and  has been authorized for detection and/or diagnosis of SARS-CoV-2 by FDA under an Emergency Use Authorization (EUA). This EUA will remain  in effect (meaning this test can be used) for the duration of the COVID-19 declaration under Section 564(b)(1) of the Act, 21 U.S.C. section 360bbb-3(b)(1), unless the authorization is terminated or revoked sooner.     Influenza A by PCR NEGATIVE NEGATIVE   Influenza B by PCR NEGATIVE NEGATIVE    Comment: (NOTE) The Xpert Xpress SARS-CoV-2/FLU/RSV assay is intended as an aid in  the diagnosis of influenza from Nasopharyngeal swab specimens and  should not be used as a sole basis for  treatment. Nasal washings and  aspirates are unacceptable for Xpert Xpress SARS-CoV-2/FLU/RSV  testing.  Fact Sheet for Patients: https://www.moore.com/  Fact Sheet for Healthcare Providers: https://www.young.biz/  This test is not yet approved or cleared by the [place] FDA and  has been authorized for detection and/or diagnosis of SARS-CoV-2 by  FDA under an Emergency Use Authorization (EUA). This EUA will remain  in effect (meaning this test can be used) for the duration of the  Covid-19 declaration under Section 564(b)(1) of the Act, 21  U.S.C. section 360bbb-3(b)(1), unless the authorization is  terminated or revoked. Performed at [facility], [address] [place], [place] [address]     US Abdomen Limited RUQ (LIVER/GB)  Result Date: 07/19/2020 CLINICAL DATA:  Right upper quadrant pain for 2 days. Elevated Mieka Leaton blood cell count. EXAM: ULTRASOUND ABDOMEN LIMITED RIGHT UPPER QUADRANT COMPARISON:  None. FINDINGS: Gallbladder: Distended. Intraluminal gallstones as well as sludge. Borderline wall thickness of 3 mm, gallbladder wall appears mildly edematous. Small amount of pericholecystic fluid. Positive sonographic Murphy sign noted by sonographer. Common bile duct: Diameter: 3 mm, normal. Liver: There may be mild central intrahepatic biliary ductal dilatation. No focal lesion identified. Within normal limits in parenchymal echogenicity. Portal vein is patent on color Doppler imaging with normal direction of blood flow towards the liver. Other: None. IMPRESSION: 1. Distended gallbladder with gallstones, sludge, borderline wall thickening and pericholecystic fluid. Positive sonographic Murphy sign. Findings are suspicious for acute cholecystitis. 2. Normal caliber common bile duct but there may be mild central intrahepatic biliary ductal dilatation. Electronically Signed   By: Narda Rutherford M.D.   On: 07/19/2020 20:49      A/P: Nyara Capell is an 19 y.o. female with acute cholecystitis  -The anatomy and physiology of the hepatobiliary system was discussed with the patient along with the pathophysiology  of gallbladder disease. -The options for treatment were discussed including ongoing observation which may result in subsequent gallbladder complications (infection, pancreatitis, choledocholithiasis, etc) and surgery - laparoscopic cholecystectomy - this admission -The planned procedure, material risks (including, but not limited to, pain, bleeding, infection, scarring, need for blood transfusion, damage to surrounding structures- blood vessels/nerves/viscus/organs, damage to bile duct, bile leak, need for additional procedures, hernia, worsening of pre-existing medical conditions, pancreatitis, pneumonia, heart attack, stroke, death) benefits and alternatives to surgery were discussed at length. I noted a good probability that the procedure would help improve their symptoms. The patient's questions were answered to their satisfaction, they voiced understanding and they elected to proceed with surgery. Additionally, we discussed typical postoperative expectations and the recovery process. -I reviewed she would meet my partner whom will likely be performing her surgery later today, Dr. Maisie Fushomas.  Stephanie Couphristopher M. Cliffton AstersWhite, M.D. [facility] Use [email or web address] to contact on call provider

## 2020-07-20 NOTE — Anesthesia Preprocedure Evaluation (Addendum)
Anesthesia Evaluation  Patient identified by MRN, date of birth, ID band Patient awake    Reviewed: Allergy & Precautions, NPO status , Patient's Chart, lab work & pertinent test results  Airway Mallampati: II  TM Distance: >3 FB Neck ROM: Full    Dental no notable dental hx. (+) Chipped, Dental Advisory Given,    Pulmonary asthma ,    Pulmonary exam normal breath sounds clear to auscultation       Cardiovascular negative cardio ROS Normal cardiovascular exam Rhythm:Regular Rate:Normal     Neuro/Psych negative neurological ROS  negative psych ROS   GI/Hepatic Neg liver ROS, Cholelithiasis    Endo/Other  negative endocrine ROS  Renal/GU negative Renal ROS  negative genitourinary   Musculoskeletal negative musculoskeletal ROS (+)   Abdominal Normal abdominal exam  (+)   Peds  Hematology negative hematology ROS (+) hct 38.5, plt 226   Anesthesia Other Findings   Reproductive/Obstetrics negative OB ROS                            Anesthesia Physical Anesthesia Plan  ASA: I  Anesthesia Plan: General   Post-op Pain Management:    Induction: Intravenous  PONV Risk Score and Plan: 3 and Ondansetron, Dexamethasone, Midazolam and Treatment may vary due to age or medical condition  Airway Management Planned: Oral ETT  Additional Equipment: None  Intra-op Plan:   Post-operative Plan: Extubation in OR  Informed Consent: I have reviewed the patients History and Physical, chart, labs and discussed the procedure including the risks, benefits and alternatives for the proposed anesthesia with the patient or authorized representative who has indicated his/her understanding and acceptance.     Dental advisory given  Plan Discussed with: CRNA  Anesthesia Plan Comments:        Anesthesia Quick Evaluation

## 2020-07-20 NOTE — Anesthesia Procedure Notes (Signed)
Date/Time: 07/20/2020 1:42 PM Performed by: Minerva Ends, CRNA Oxygen Delivery Method: Simple face mask Placement Confirmation: positive ETCO2 and breath sounds checked- equal and bilateral Dental Injury: Teeth and Oropharynx as per pre-operative assessment

## 2020-07-20 NOTE — Anesthesia Procedure Notes (Signed)
Procedure Name: Intubation Date/Time: 07/20/2020 12:33 PM Performed by: Minerva Ends, CRNA Pre-anesthesia Checklist: Patient identified, Emergency Drugs available, Suction available and Patient being monitored Patient Re-evaluated:Patient Re-evaluated prior to induction Oxygen Delivery Method: Circle System Utilized Preoxygenation: Pre-oxygenation with 100% oxygen Induction Type: IV induction and Cricoid Pressure applied Ventilation: Mask ventilation without difficulty Laryngoscope Size: Miller and 2 Tube type: Oral Tube size: 7.0 mm Number of attempts: 1 Airway Equipment and Method: Stylet Placement Confirmation: ETT inserted through vocal cords under direct vision,  positive ETCO2 and breath sounds checked- equal and bilateral Secured at: 22 cm Tube secured with: Tape Dental Injury: Teeth and Oropharynx as per pre-operative assessment  Comments: Smooth induction Finucane- intubation atraumatic-- teeth and mouth as preop(small chip front tooth)- bilat BS Finucane

## 2020-07-20 NOTE — Discharge Summary (Signed)
[facility] Discharge Summary   Patient ID: Kaitlyn Dunn MRN: [id number] DOB/AGE: 12-14-2000 19 y.o.  Admit date: 07/19/2020 Discharge date: 07/21/2020  Discharge Diagnosis Patient Active Problem List   Diagnosis Date Noted  . Acute cholecystitis 07/20/2020    Consultants None   Imaging: US Abdomen Limited RUQ (LIVER/GB)  Result Date: 07/19/2020 CLINICAL DATA:  Right upper quadrant pain for 2 days. Elevated white blood cell count. EXAM: ULTRASOUND ABDOMEN LIMITED RIGHT UPPER QUADRANT COMPARISON:  None. FINDINGS: Gallbladder: Distended. Intraluminal gallstones as well as sludge. Borderline wall thickness of 3 mm, gallbladder wall appears mildly edematous. Small amount of pericholecystic fluid. Positive sonographic Murphy sign noted by sonographer. Common bile duct: Diameter: 3 mm, normal. Liver: There may be mild central intrahepatic biliary ductal dilatation. No focal lesion identified. Within normal limits in parenchymal echogenicity. Portal vein is patent on color Doppler imaging with normal direction of blood flow towards the liver. Other: None. IMPRESSION: 1. Distended gallbladder with gallstones, sludge, borderline wall thickening and pericholecystic fluid. Positive sonographic Murphy sign. Findings are suspicious for acute cholecystitis. 2. Normal caliber common bile duct but there may be mild central intrahepatic biliary ductal dilatation. Electronically Signed   By: Narda Rutherford M.D.   On: 07/19/2020 20:49    Procedures Dr. Romie Levee (07/20/20)- Laparoscopic Cholecystectomy  HPI: Kaitlyn Dunn is an 19 y.o. female with hx asthma presented to [facility] with acute onset RUQ pain that is shar x1 day following chinese food. Associated n/v. Has tried Zofran with minimal alleviation in symptoms. Pain does not radiate. Nothing makes it better or worse. Moderate in severity. Denies ever having had anything like this before.  Hospital Course:  Workup significant for  acute cholecystitis (See RUQ U/S above) and the patient was taken for the above operation. She tolerated the procedure well and was transferred to the floor. Diet was advanced as tolerated. On 07/20/20 the patients vitals were stable, pain controlled on oral medications, tolerating PO, mobilizing independently, and felt stable for discharge home. She will follow up as below and knows to call with questions or concerns.  Physical Exam: General:  Alert, NAD, pleasant, comfortable Lungs: CTA b/l, normal rate and effort  Heart: RRR Abd:  Soft, ND, appropriately tender around incisions, incisions c/d/i without cellulitis or drainage. +BS  Allergies as of 07/21/2020   No Known Allergies     Medication List    STOP taking these medications   lidocaine 2 % solution Commonly known as: XYLOCAINE     TAKE these medications   acetaminophen 500 MG tablet Commonly known as: TYLENOL Take 2 tablets (1,000 mg total) by mouth every 6 (six) hours.   dextromethorphan-guaiFENesin 30-600 MG 12hr tablet Commonly known as: MUCINEX DM Take 1 tablet by mouth 2 (two) times daily.   docusate sodium 100 MG capsule Commonly known as: COLACE Take 1 capsule (100 mg total) by mouth 2 (two) times daily as needed for mild constipation.   Isibloom 0.15-30 MG-MCG tablet Generic drug: desogestrel-ethinyl estradiol Take 1 tablet by mouth daily.   loratadine 10 MG tablet Commonly known as: CLARITIN Take 10 mg by mouth daily.   oxyCODONE 5 MG immediate release tablet Commonly known as: Oxy IR/ROXICODONE Take 1 tablet (5 mg total) by mouth every 6 (six) hours as needed for breakthrough pain.   ProAir HFA 108 (90 Base) MCG/ACT inhaler Generic drug: albuterol Inhale 2 puffs into the lungs every 4 (four) hours as needed.   rizatriptan 10 MG tablet Commonly known as: MAXALT Take  10 mg by mouth daily.        Follow-up Information    [facility] Follow up.   Specialty: General Surgery Why:  Our office is scheduling you for post-operative follow up. Please call to confirm appointment date/time. Please arrive 30 minutes early to your appointment. Please bring a copy of your photo ID and insurance card to the appointment.  Contact information: [address] [place] [address] [phone]              Signed: Leary Roca, [facility] 07/21/2020, 9:38 AM

## 2020-07-21 ENCOUNTER — Encounter (HOSPITAL_COMMUNITY): Payer: Self-pay | Admitting: General Surgery

## 2020-07-21 LAB — SURGICAL PATHOLOGY

## 2020-07-21 MED ORDER — ACETAMINOPHEN 500 MG PO TABS: 1000.0000 mg | ORAL_TABLET | Freq: Four times a day (QID) | ORAL | 0 refills | Status: AC

## 2020-07-21 MED ORDER — OXYCODONE HCL 5 MG PO TABS: 5.0000 mg | ORAL_TABLET | Freq: Four times a day (QID) | ORAL | 0 refills | Status: AC | PRN

## 2020-07-21 MED ORDER — DOCUSATE SODIUM 100 MG PO CAPS: 100.0000 mg | ORAL_CAPSULE | Freq: Two times a day (BID) | ORAL | 0 refills | Status: AC | PRN

## 2020-07-21 NOTE — Anesthesia Postprocedure Evaluation (Signed)
Anesthesia Post Note  Patient: Kaitlyn Dunn  Procedure(s) Performed: LAPAROSCOPIC CHOLECYSTECTOMY (N/A )     Patient location during evaluation: PACU Anesthesia Type: General Level of consciousness: awake and alert Pain management: pain level controlled Vital Signs Assessment: post-procedure vital signs reviewed and stable Respiratory status: spontaneous breathing, nonlabored ventilation, respiratory function stable and patient connected to nasal cannula oxygen Cardiovascular status: blood pressure returned to baseline and stable Postop Assessment: no apparent nausea or vomiting Anesthetic complications: no   No complications documented.  Last Vitals:  Vitals:   07/21/20 0138 07/21/20 0529  BP: 101/63 (!) 120/58  Pulse: 75 67  Resp: 17 17  Temp: 36.6 C 36.9 C  SpO2: 99% 99%    Last Pain:  Vitals:   07/21/20 0946  TempSrc:   PainSc: 3                  Tamu Golz L Jhoana Upham

## 2020-07-23 LAB — NASOPHARYNGEAL CULTURE: Special Requests: NORMAL

## 2021-06-25 IMAGING — US US ABDOMEN LIMITED
1 series · 14 of 25 positions shown · non-contrast
Comparison: None.

CLINICAL DATA: Right upper quadrant pain for 2 days. Elevated white
blood cell count.

EXAM:
ULTRASOUND ABDOMEN LIMITED RIGHT UPPER QUADRANT

[Series 1: us abdomen limited · 14 of 53 slices shown]
[im 1/53]
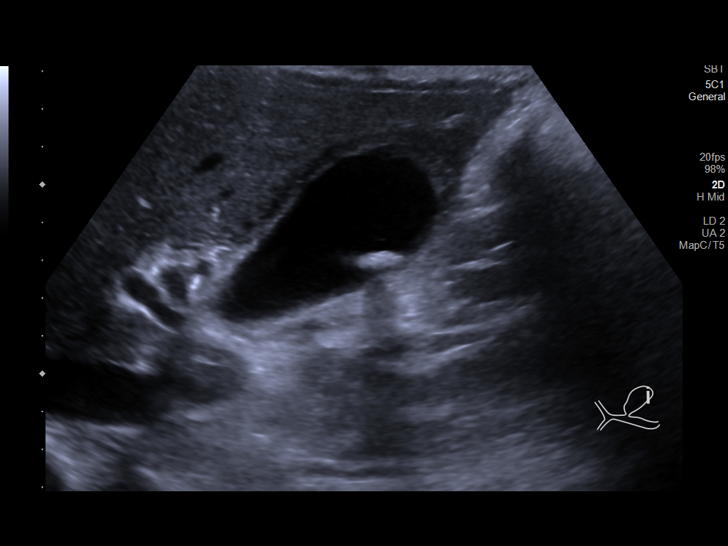
[im 5/53]
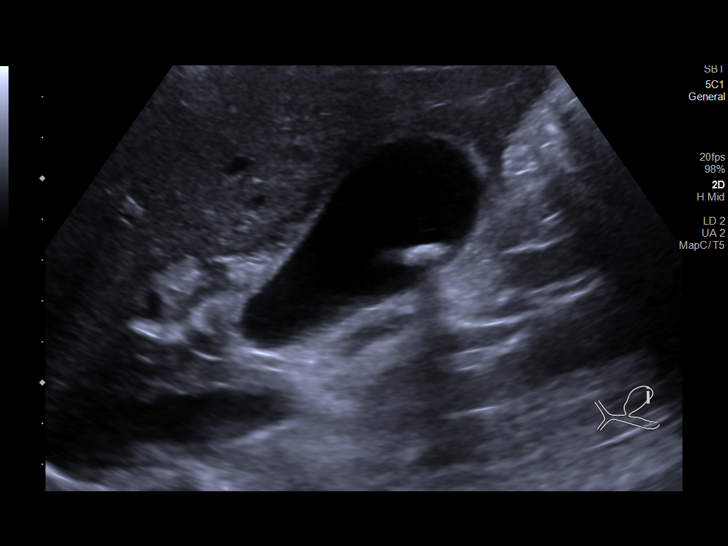
[im 9/53]
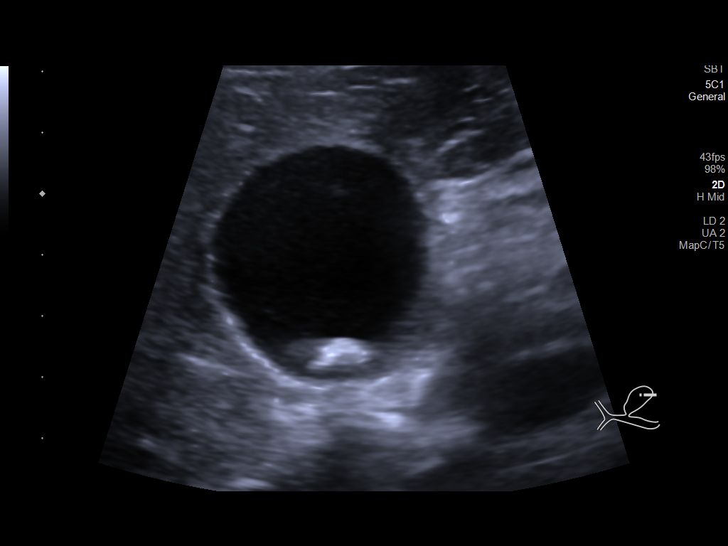
[im 14/53]
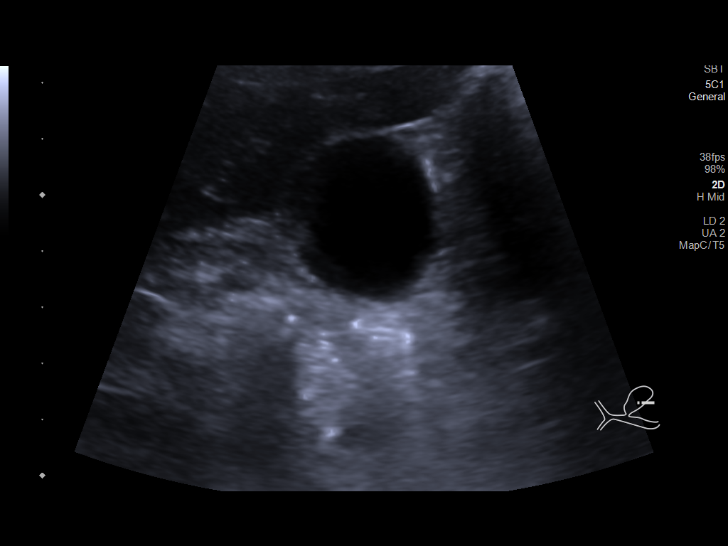
[im 18/53]
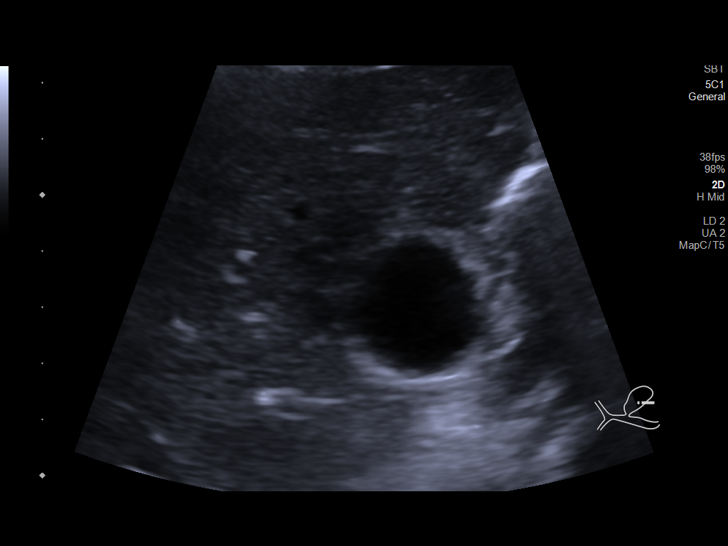
[im 20/53]
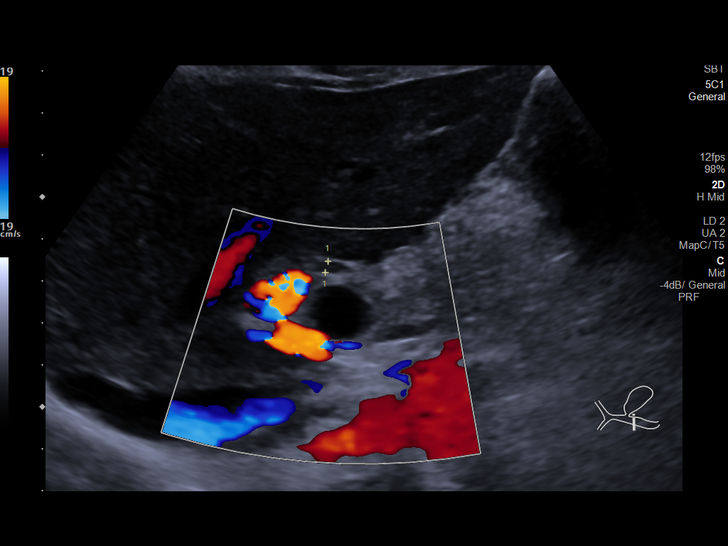
[im 24/53]
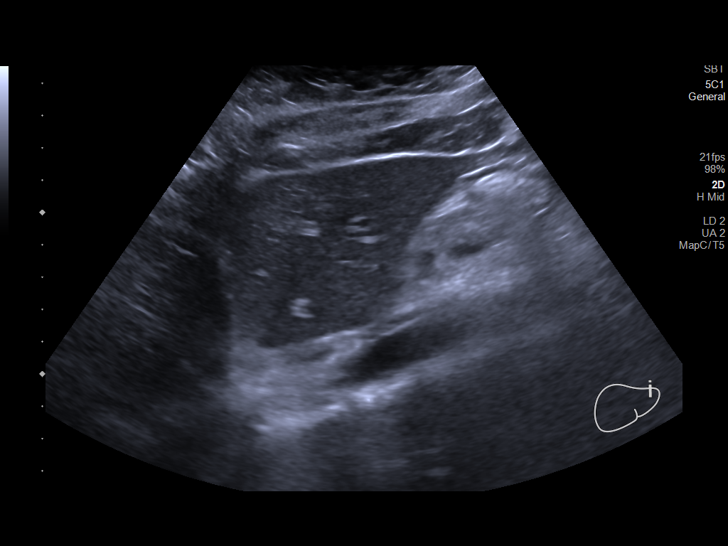
[im 29/53]
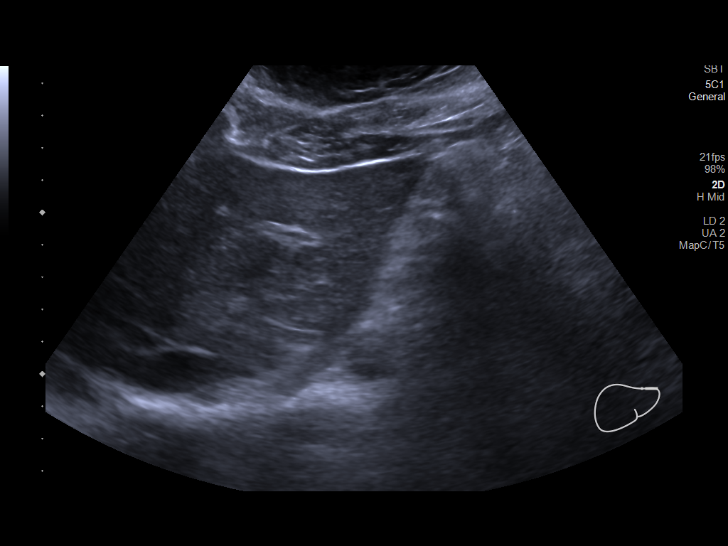
[im 33/53]
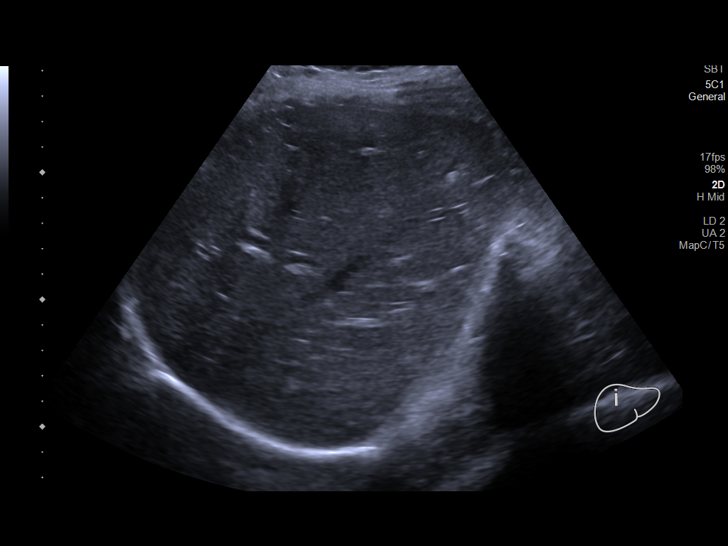
[im 35/53]
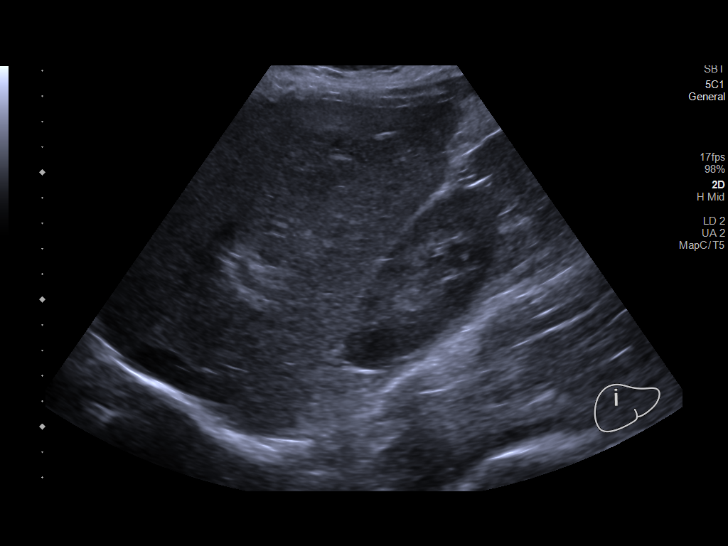
[im 40/53]
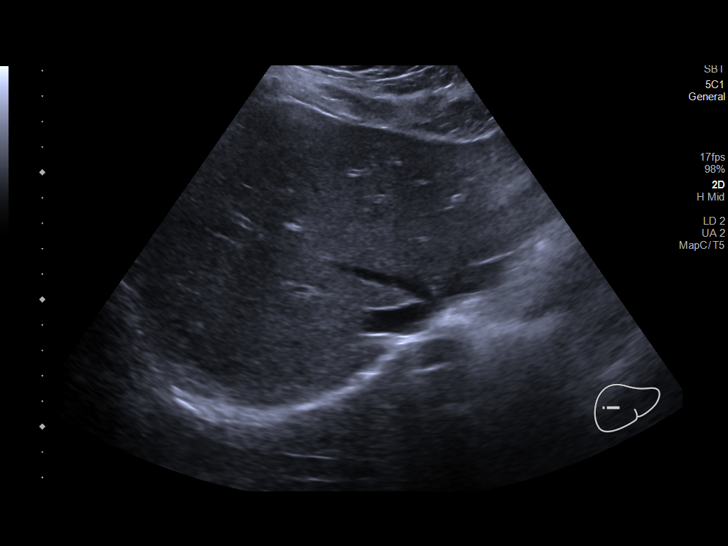
[im 44/53]
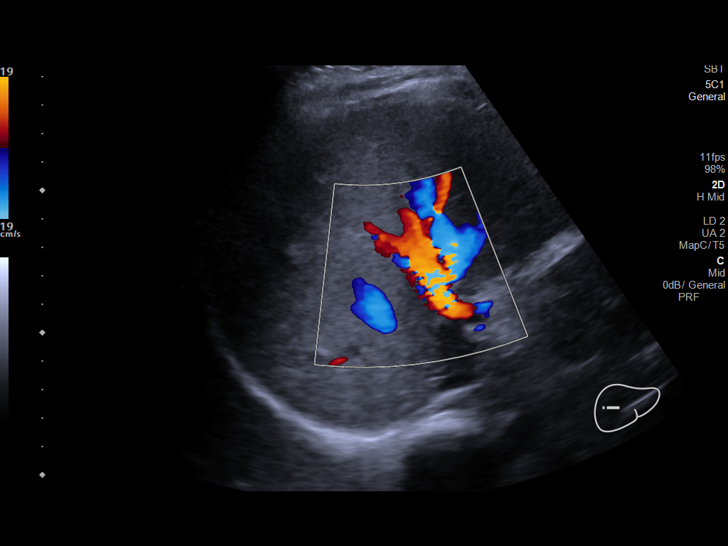
[im 48/53]
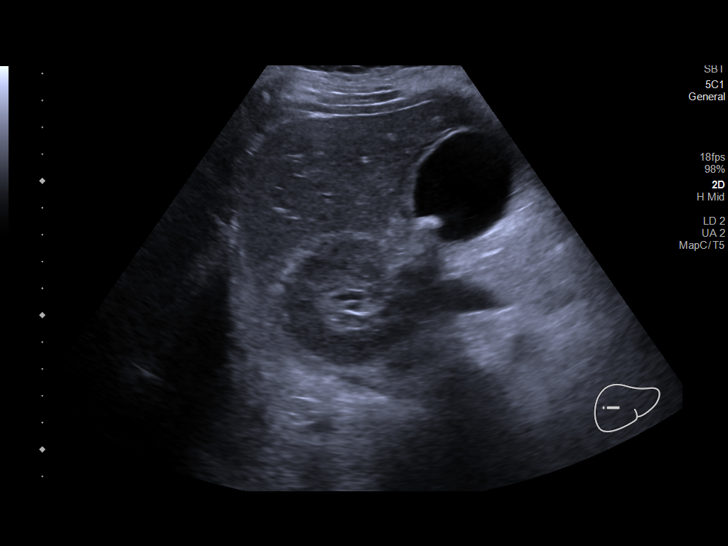
[im 53/53]
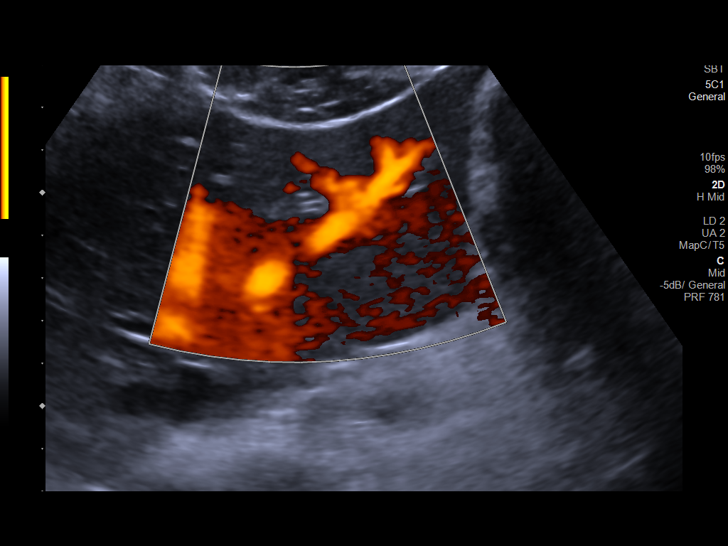

[14 of 25 positions shown; findings below may reference images not displayed]

FINDINGS: Gallbladder:

Distended. Intraluminal gallstones as well as sludge. Borderline
wall thickness of 3 mm, gallbladder wall appears mildly edematous.
Small amount of pericholecystic fluid. Positive sonographic Murphy
sign noted by sonographer.

Common bile duct:

Diameter: 3 mm, normal.

Liver:

There may be mild central intrahepatic biliary ductal dilatation. No
focal lesion identified. Within normal limits in parenchymal
echogenicity. Portal vein is patent on color Doppler imaging with
normal direction of blood flow towards the liver.

Other: None.
IMPRESSION: 1. Distended gallbladder with gallstones, sludge, borderline wall
thickening and pericholecystic fluid. Positive sonographic Murphy
sign. Findings are suspicious for acute cholecystitis.
2. Normal caliber common bile duct but there may be mild central
intrahepatic biliary ductal dilatation.
